# Patient Record
Sex: Male | Born: 1945 | Race: White | Hispanic: No | Marital: Single | Smoking: Former smoker
Health system: Southern US, Community
[De-identification: ages and names within clinical notes are randomized; demographics above are authoritative.]

## PROBLEM LIST (undated history)

## (undated) DIAGNOSIS — F4310 Post-traumatic stress disorder, unspecified: Secondary | ICD-10-CM

## (undated) DIAGNOSIS — I10 Essential (primary) hypertension: Secondary | ICD-10-CM

---

## 2012-04-01 ENCOUNTER — Ambulatory Visit: Payer: Self-pay | Admitting: Internal Medicine

## 2012-10-25 ENCOUNTER — Ambulatory Visit: Payer: Self-pay | Admitting: Internal Medicine

## 2014-02-20 IMAGING — CR RIGHT INDEX FINGER 2+V
1 series · 3 of 3 positions shown · non-contrast
Comparison: none

REASON FOR EXAM: pain and swelling after hitting it playing with a dog
COMMENTS:

PROCEDURE:     MDR - MDR FINGER INDEX 2ND DIG RT HA  - October 25, 2012 [DATE]
RESULT:     There is no evidence of fracture, dislocation, or malalignment.

[Series 1: pa · 0.17mm/px · 3 of 3 slices shown]
[im 1/3]
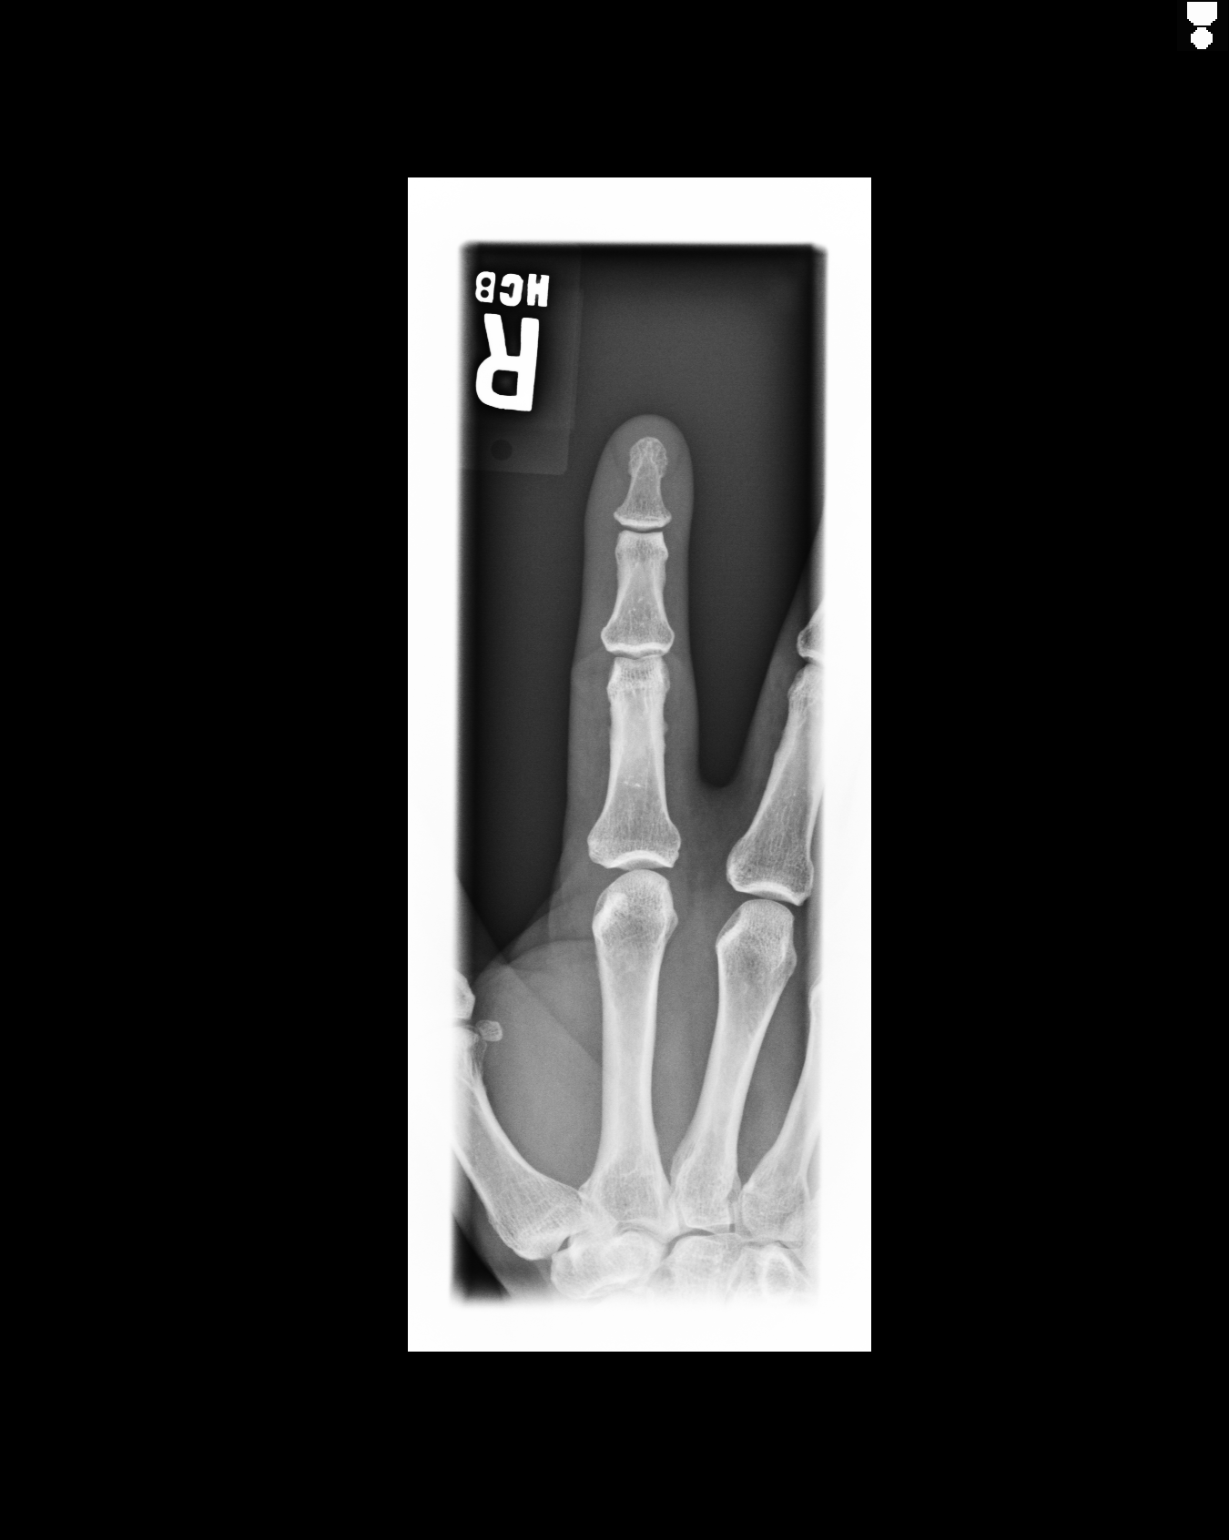
[im 2/3]
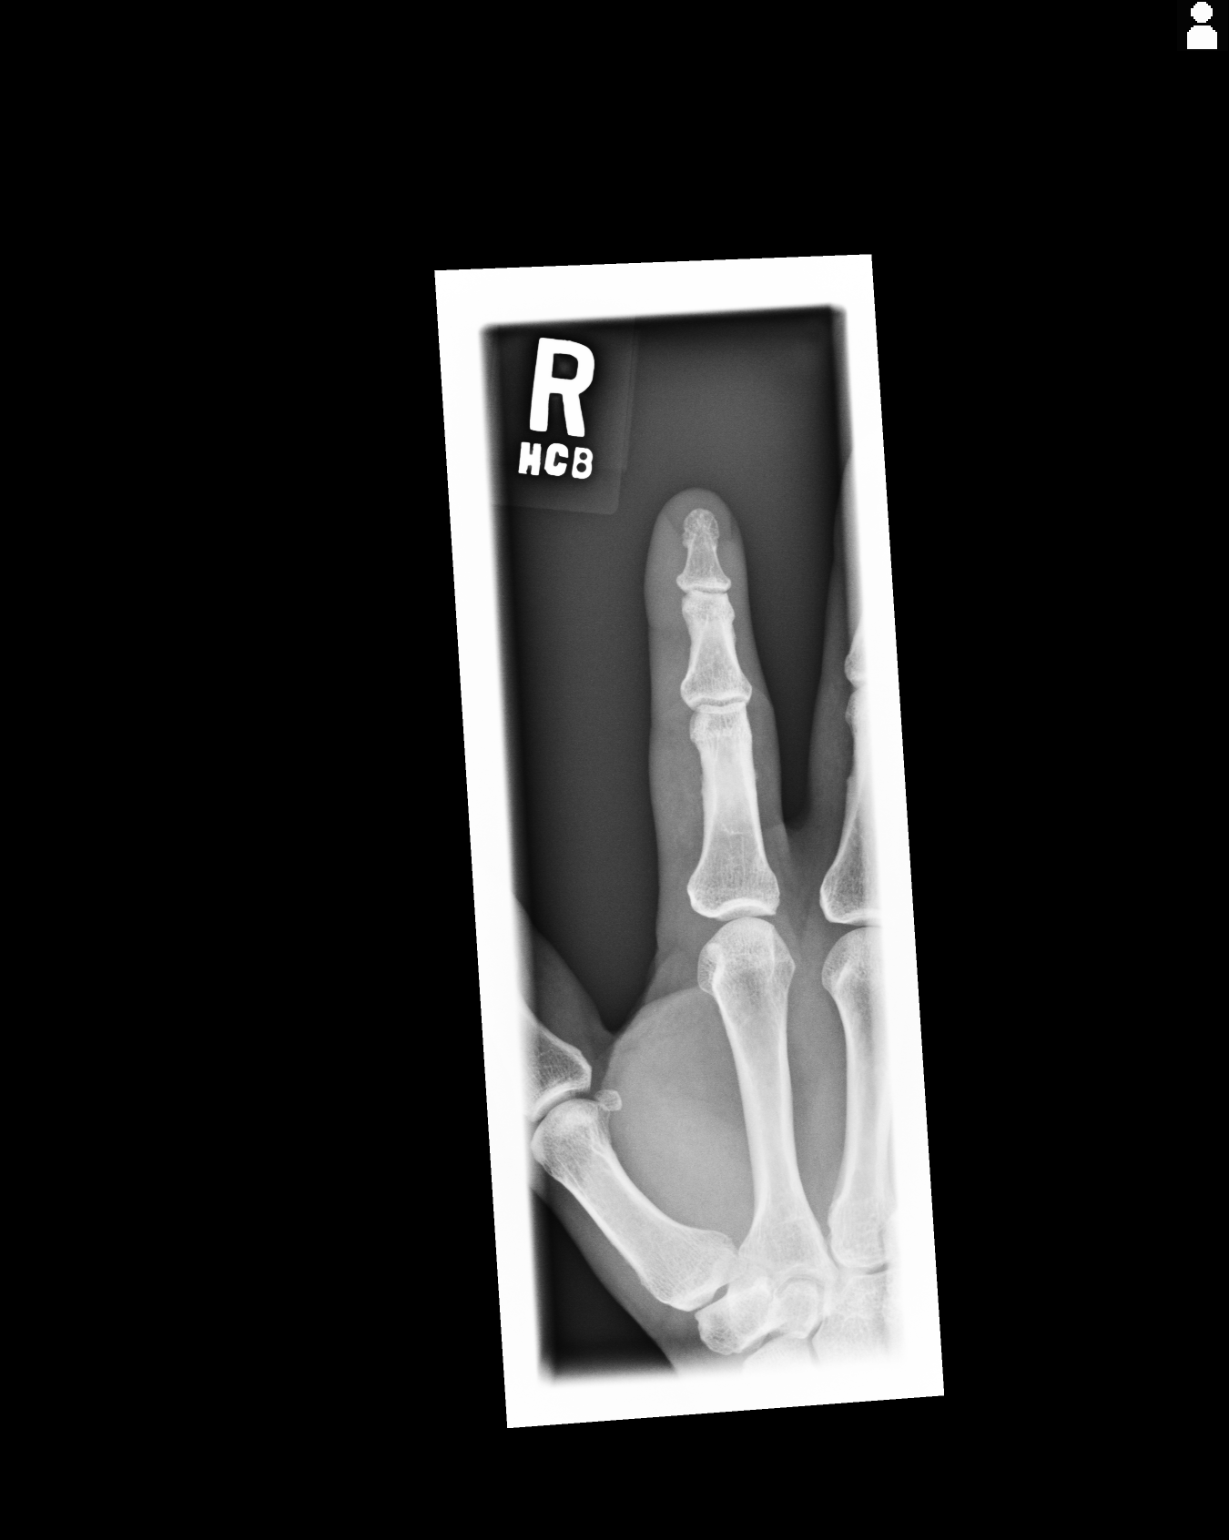
[im 3/3]
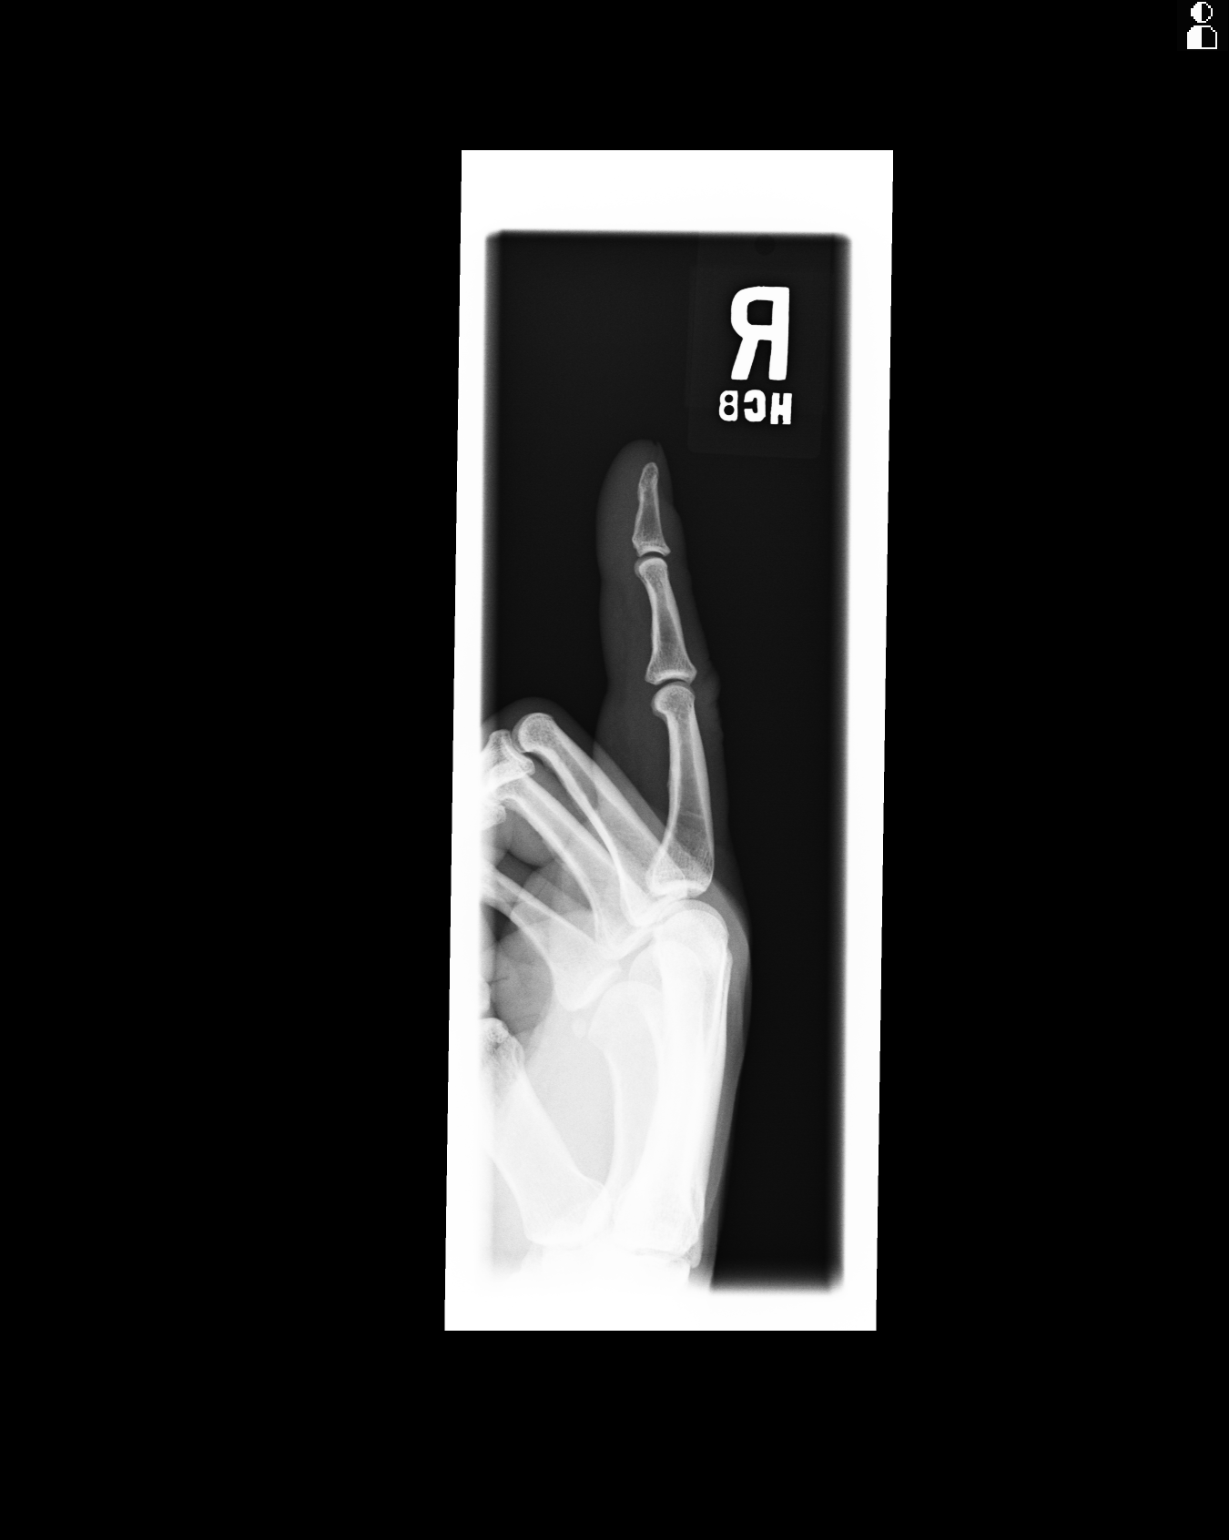

[3 of 3 positions shown; findings below may reference images not displayed]

IMPRESSION: 1. No evidence of acute abnormalities.
2. If there are persistent complaints of pain or persistent clinical
concern, a repeat evaluation in 7-10 days is recommended if clinically
warranted.

## 2015-05-31 ENCOUNTER — Ambulatory Visit
Admission: EM | Admit: 2015-05-31 | Discharge: 2015-05-31 | Disposition: A | Discharge status: Home / Self Care | Payer: PPO | Attending: Family Medicine | Admitting: Family Medicine

## 2015-05-31 DIAGNOSIS — I10 Essential (primary) hypertension: Secondary | ICD-10-CM | POA: Diagnosis not present

## 2015-05-31 HISTORY — DX: Post-traumatic stress disorder, unspecified: F43.10

## 2015-05-31 MED ORDER — CLONIDINE HCL 0.1 MG PO TABS
0.1000 mg | ORAL_TABLET | Freq: Once | ORAL | Status: AC
  Administered 2015-05-31: 0.1 mg via ORAL

## 2015-05-31 NOTE — ED Provider Notes (Signed)
CSN: [id number]     Arrival date & time 05/31/15  1245 History   First MD Initiated Contact with Patient 05/31/15 1435     Chief Complaint  Patient presents with  . Hypertension   (Consider location/radiation/quality/duration/timing/severity/associated sxs/prior Treatment) HPI Comments: 69 yo male presents with a c/o elevated blood pressure for the past 2-3 days. States does not have a h/o hypertension. Denies any chest pain, shortness of breath, vision changes, numbness/tingling or weakness. States has had a headache. Also states last week he took an over the counter supplement "Steel Libido Red" and "my body didn't feel right after taking it".   Patient is a 69 y.o. male presenting with hypertension. The history is provided by the patient.  Hypertension    Past Medical History  Diagnosis Date  . Post traumatic stress disorder (PTSD)    History reviewed. No pertinent past surgical history. History reviewed. No pertinent family history. Social History  Substance Use Topics  . Smoking status: Former Games developermoker  . Smokeless tobacco: None  . Alcohol Use: Yes     Comment: soccially    Review of Systems  Allergies  Latex  Home Medications   Prior to Admission medications   Medication Sig Start Date End Date Taking? Authorizing Provider  terazosin (HYTRIN) 10 MG capsule Take 10 mg by mouth at bedtime.   Yes Historical Provider, MD   Meds Ordered and Administered this Visit   Medications  cloNIDine (CATAPRES) tablet 0.1 mg (0.1 mg Oral Given 05/31/15 1455)    BP 168/90 mmHg  Pulse 47  Temp(Src) 97.6 F (36.4 C) (Tympanic)  Resp 16  Ht 5\' 8"  (1.727 m)  Wt 135 lb (61.236 kg)  BMI 20.53 kg/m2  SpO2 100% No data found.   Physical Exam  Constitutional: He appears well-developed and well-nourished. No distress.  Cardiovascular: Normal rate, regular rhythm and normal heart sounds.   Pulmonary/Chest: Effort normal and breath sounds normal. No respiratory distress. He has no  wheezes. He has no rales.  Musculoskeletal: He exhibits no edema.  Skin: He is not diaphoretic.  Nursing note and vitals reviewed.   ED Course  Procedures (including critical care time)  Labs Review Labs Reviewed - No data to display  Imaging Review No results found.   Visual Acuity Review  Right Eye Distance:   Left Eye Distance:   Bilateral Distance:    Right Eye Near:   Left Eye Near:    Bilateral Near:         MDM   1. Essential hypertension    1. diagnosis reviewed with patient 2. Patient given clonidine 0.1mg  po x 1 with improvement of blood pressure reading (164/90 on recheck) 3. Recommend patient [facility] for recheck bp and possibly start chronic antihypertensive   Payton Mccallumrlando Kavian Peters, MD 05/31/15 2122
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
## 2015-05-31 NOTE — Discharge Instructions (Signed)
Hypertension Hypertension, commonly called high blood pressure, is when the force of blood pumping through your arteries is too strong. Your arteries are the blood vessels that carry blood from your heart throughout your body. A blood pressure reading consists of a higher number over a lower number, such as 110/72. The higher number (systolic) is the pressure inside your arteries when your heart pumps. The lower number (diastolic) is the pressure inside your arteries when your heart relaxes. Ideally you want your blood pressure below 120/80. Hypertension forces your heart to work harder to pump blood. Your arteries may become narrow or stiff. Having untreated or uncontrolled hypertension can cause heart attack, stroke, kidney disease, and other problems. RISK FACTORS Some risk factors for high blood pressure are controllable. Others are not.  Risk factors you cannot control include:   Race. You may be at higher risk if you are African American.  Age. Risk increases with age.  Gender. Men are at higher risk than women before age 45 years. After age 65, women are at higher risk than men. Risk factors you can control include:  Not getting enough exercise or physical activity.  Being overweight.  Getting too much fat, sugar, calories, or salt in your diet.  Drinking too much alcohol. SIGNS AND SYMPTOMS Hypertension does not usually cause signs or symptoms. Extremely high blood pressure (hypertensive crisis) may cause headache, anxiety, shortness of breath, and nosebleed. DIAGNOSIS To check if you have hypertension, your health care provider will measure your blood pressure while you are seated, with your arm held at the level of your heart. It should be measured at least twice using the same arm. Certain conditions can cause a difference in blood pressure between your right and left arms. A blood pressure reading that is higher than normal on one occasion does not mean that you need treatment. If  it is not clear whether you have high blood pressure, you may be asked to return on a different day to have your blood pressure checked again. Or, you may be asked to monitor your blood pressure at home for 1 or more weeks. TREATMENT Treating high blood pressure includes making lifestyle changes and possibly taking medicine. Living a healthy lifestyle can help lower high blood pressure. You may need to change some of your habits. Lifestyle changes may include:  Following the DASH diet. This diet is high in fruits, vegetables, and whole grains. It is low in salt, red meat, and added sugars.  Keep your sodium intake below 2,300 mg per day.  Getting at least 30-45 minutes of aerobic exercise at least 4 times per week.  Losing weight if necessary.  Not smoking.  Limiting alcoholic beverages.  Learning ways to reduce stress. Your health care provider may prescribe medicine if lifestyle changes are not enough to get your blood pressure under control, and if one of the following is true:  You are 18-59 years of age and your systolic blood pressure is above 140.  You are 60 years of age or older, and your systolic blood pressure is above 150.  Your diastolic blood pressure is above 90.  You have diabetes, and your systolic blood pressure is over 140 or your diastolic blood pressure is over 90.  You have kidney disease and your blood pressure is above 140/90.  You have heart disease and your blood pressure is above 140/90. Your personal target blood pressure may vary depending on your medical conditions, your age, and other factors. HOME CARE INSTRUCTIONS    Have your blood pressure rechecked as directed by your health care provider.   Take medicines only as directed by your health care provider. Follow the directions carefully. Blood pressure medicines must be taken as prescribed. The medicine does not work as well when you skip doses. Skipping doses also puts you at risk for  problems.  Do not smoke.   Monitor your blood pressure at home as directed by your health care provider. SEEK MEDICAL CARE IF:   You think you are having a reaction to medicines taken.  You have recurrent headaches or feel dizzy.  You have swelling in your ankles.  You have trouble with your vision. SEEK IMMEDIATE MEDICAL CARE IF:  You develop a severe headache or confusion.  You have unusual weakness, numbness, or feel faint.  You have severe chest or abdominal pain.  You vomit repeatedly.  You have trouble breathing. MAKE SURE YOU:   Understand these instructions.  Will watch your condition.  Will get help right away if you are not doing well or get worse.   This information is not intended to replace advice given to you by your health care provider. Make sure you discuss any questions you have with your health care provider.   Document Released: 07/16/2005 Document Revised: 11/30/2014 Document Reviewed: 05/08/2013 Elsevier Interactive Patient Education 2016 Elsevier Inc.  

## 2017-06-14 ENCOUNTER — Ambulatory Visit
Admission: EM | Admit: 2017-06-14 | Discharge: 2017-06-14 | Disposition: A | Discharge status: Home / Self Care | Payer: PPO | Attending: Emergency Medicine | Admitting: Emergency Medicine

## 2017-06-14 ENCOUNTER — Encounter: Payer: Self-pay | Admitting: *Deleted

## 2017-06-14 DIAGNOSIS — R002 Palpitations: Secondary | ICD-10-CM

## 2017-06-14 DIAGNOSIS — I495 Sick sinus syndrome: Secondary | ICD-10-CM | POA: Diagnosis not present

## 2017-06-14 DIAGNOSIS — I10 Essential (primary) hypertension: Secondary | ICD-10-CM | POA: Diagnosis not present

## 2017-06-14 DIAGNOSIS — H538 Other visual disturbances: Secondary | ICD-10-CM | POA: Insufficient documentation

## 2017-06-14 DIAGNOSIS — F4310 Post-traumatic stress disorder, unspecified: Secondary | ICD-10-CM | POA: Insufficient documentation

## 2017-06-14 DIAGNOSIS — T887XXA Unspecified adverse effect of drug or medicament, initial encounter: Secondary | ICD-10-CM

## 2017-06-14 DIAGNOSIS — Z91040 Latex allergy status: Secondary | ICD-10-CM | POA: Insufficient documentation

## 2017-06-14 DIAGNOSIS — Z87891 Personal history of nicotine dependence: Secondary | ICD-10-CM | POA: Diagnosis not present

## 2017-06-14 DIAGNOSIS — T50905A Adverse effect of unspecified drugs, medicaments and biological substances, initial encounter: Secondary | ICD-10-CM

## 2017-06-14 DIAGNOSIS — Z5321 Procedure and treatment not carried out due to patient leaving prior to being seen by health care provider: Secondary | ICD-10-CM | POA: Insufficient documentation

## 2017-06-14 HISTORY — DX: Essential (primary) hypertension: I10

## 2017-06-14 LAB — BASIC METABOLIC PANEL
Anion gap: 7 (ref 5–15)
BUN: 15 mg/dL (ref 6–20)
CALCIUM: 9.5 mg/dL (ref 8.9–10.3)
CO2: 28 mmol/L (ref 22–32)
CREATININE: 0.83 mg/dL (ref 0.61–1.24)
Chloride: 104 mmol/L (ref 101–111)
GFR calc Af Amer: >60 mL/min (ref >60)
Glucose, Bld: 97 mg/dL (ref 65–99)
Potassium: 4 mmol/L (ref 3.5–5.1)
SODIUM: 139 mmol/L (ref 135–145)

## 2017-06-14 MED ORDER — LISINOPRIL 10 MG PO TABS: 10.0000 mg | ORAL_TABLET | Freq: Every day | ORAL | 0 refills | Status: DC

## 2017-06-14 MED ORDER — CLONIDINE HCL 0.2 MG PO TABS
0.2000 mg | ORAL_TABLET | Freq: Once | ORAL | Status: AC
  Administered 2017-06-14: 0.2 mg via ORAL
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
## 2017-06-14 NOTE — ED Triage Notes (Signed)
Pt c/o "heart racing" and weakness. Onset approx 0600. Denies chest pain, dyspnea.

## 2017-06-14 NOTE — Discharge Instructions (Addendum)
Decrease your salt intake. diet and exercise will lower your blood pressure significantly. It is important to keep your blood pressure under good control, as having a elevated for prolonged periods of time significantly increases your risk of stroke, heart attacks, kidney damage, eye damage, and other problems. Measure your blood pressure once a day, preferably at the same time every day. Keep a log of this and bring it to your next doctor's appointment.  See list below for primary care referral.  Return immediately to the ER if you start having chest pain, headache, problems seeing, problems talking, problems walking, if you feel like you're about to pass out, if you do pass out, if you have a seizure, if your blood pressure stays definitely elevated despite being on the medications, or for any other concerns.  Here is a list of primary care providers who are taking new patients:  Dr. Elizabeth Sauereanna Jones, Dr. Schuyler AmorWilliam Plonk [address] [place] [address] [phone]  [facility] [place] [address]  [place] [address]  [phone]  [facility] [address]  [place], [place] [address] [phone]  [facility] [place] [address]  [phone] [place], [place] [address]  Here are clinics/ other resources who will see you if you do not have insurance. Some have certain criteria that you must meet. Call them and find out what they are:  [facility]: [address] [place] [place], [place] [address] Phone: [phone] Hours: First and Third Saturdays of each Month, 9 a.m. - 1 p.m.  [facility]: [address], [place], [place] [address] Phone: [phone] Hours: Tuesday, 4 p.m. - 8 p.m. Thursday, 1 p.m. - 8 p.m. Wednesday, 9 a.m. - [facility]  [facility] [address], [place], [place] [address] Phone: [phone] Pharmacy Phone Number: [phone] Dental Phone Number: [phone] [facility] Insurance Help:  [phone]  Dental Hours: Monday - Thursday, 8 a.m. - 6 p.m.  [facility] [address] [place], [place] [address] Phone: [phone] Pharmacy Phone Number: [phone] [facility] Insurance Help: [phone]  [facility] [address] [place], [place] [address] Phone: [phone] Pharmacy Phone Number: [phone] [facility] Insurance Help: [phone]  [facility] [address] [place], [place] [address] Phone: [phone] [facility] Insurance Help: [phone]   [facility] [address] [place], [place] [address] Phone: [phone]  Go to www.goodrx.com to look up your medications. This will give you a list of where you can find your prescriptions at the most affordable prices. Or ask the pharmacist what the cash price is, or if they have any other discount programs available to help make your medication more affordable. This can be less expensive than what you would pay with insurance.

## 2017-06-14 NOTE — ED Provider Notes (Signed)
HPI  SUBJECTIVE:  Martin Bauer is a 71 y.o. male who presents with sensation of "my heart beating out of my chest "starting this morning around [phone].  States he woke up with it.  States that he had taken 50 mg of Seroquel and took 2 "swallows "of liquid diphenhydramine 50 mg last night in an effort to sleep.  He states that he did not measure the Benadryl out.  He has not taken the Seroquel in several months.  He reports palpitations, and states that they have mostly resolved.  This lasted approximately 1 1/2-2 hours.  He denies chest pain, pressure, heaviness, nausea, diaphoresis, shortness of breath, weakness.  No abdominal pain, syncope.  He denies taking any supplements.  Measured his blood pressure at home he reports blurry vision, but states that this has cleared,. He measured his blood pressure at home and it was 188/59.   States he is not on any blood pressure medicines and does not take any medicines on a regular basis.  There are no aggravating or alleviating factors. there is no exertional component to this.  He has not tried anything for this.  No symptoms like this before. He has a past medical history of hypertension but has not taken his amlodipine "in a while because it was not working".  No history of MI, coronary disease, hypercholesterolemia, atrial fibrillation, arrhythmia.  [facility]  Past [facility] History:  Diagnosis Date  . Hypertension   . Post traumatic stress disorder (PTSD)     History reviewed. No pertinent surgical history.  History reviewed. No pertinent family history.  Social History   Tobacco Use  . Smoking status: Former Games developermoker  . Smokeless tobacco: Never Used  Substance Use Topics  . Alcohol use: Yes    Comment: soccially  . Drug use: No    No current facility-administered medications for this encounter.   Current Outpatient Medications:  .  amLODipine (NORVASC) 10 MG tablet, Take 10 mg daily by mouth., Disp: , Rfl:  .  QUEtiapine (SEROQUEL) 50  MG tablet, Take 50 mg at bedtime by mouth., Disp: , Rfl:  .  lisinopril (PRINIVIL,ZESTRIL) 10 MG tablet, Take 1 tablet (10 mg total) daily by mouth., Disp: 30 tablet, Rfl: 0 .  terazosin (HYTRIN) 10 MG capsule, Take 10 mg by mouth at bedtime., Disp: , Rfl:   Allergies  Allergen Reactions  . Latex Rash     ROS  As noted in HPI.   Physical Exam  BP (!) 187/80 (BP Location: Left Arm)   Pulse (!) 53   Temp 97.7 F (36.5 C) (Oral)   Resp 16   SpO2 100%    Vitals:   06/14/17 0810 06/14/17 0859 06/14/17 1016  BP: (!) 229/103 (!) 221/110 (!) 187/80  Pulse: (!) 53    Resp: 16    Temp: 97.7 F (36.5 C)    TempSrc: Oral    SpO2: 100%     BP Readings from Last 3 Encounters:  06/14/17 (!) 187/80  05/31/15 (!) 168/90   Constitutional: Well developed, well nourished, no acute distress Eyes:  EOMI, conjunctiva normal bilaterally HENT: Normocephalic, atraumatic,mucus membranes moist Respiratory: Normal inspiratory effort is clear bilaterally Cardiovascular: Regular bradycardia, no murmurs, rubs, gallops. RP, PT 2+ and equal bilaterally  GI: nondistended soft, nontender, with active bowel sounds  skin: No rash, skin intact Musculoskeletal: no deformities, no edema, calves symmetric, nontender Neurologic: Alert & oriented x 3, no focal neuro deficits Psychiatric: Speech and behavior appropriate  ED Course   Medications  cloNIDine (CATAPRES) tablet 0.2 mg (0.2 mg Oral Given 06/14/17 1000)    Orders Placed This Encounter  Procedures  . Basic metabolic panel    Standing Status:   Standing    Number of Occurrences:   1  . Re-check Vital Signs    Standing Status:   Standing    Number of Occurrences:   1  . Re-check Vital Signs    In 30 min after meds    Standing Status:   Standing    Number of Occurrences:   1    Results for orders placed or performed during the hospital encounter of 06/14/17 (from the past 24 hour(s))  Basic metabolic panel     Status: None    Collection Time: 06/14/17  9:00 AM  Result Value Ref Range   Sodium 139 135 - 145 mmol/L   Potassium 4.0 3.5 - 5.1 mmol/L   Chloride 104 101 - 111 mmol/L   CO2 28 22 - 32 mmol/L   Glucose, Bld 97 65 - 99 mg/dL   BUN 15 6 - 20 mg/dL   Creatinine, Ser 1.61 0.61 - 1.24 mg/dL   Calcium 9.5 8.9 - 09.6 mg/dL   GFR calc non Af Amer >60 >60 mL/min   GFR calc Af Amer >60 >60 mL/min   Anion gap 7 5 - 15   No results found.  ED Clinical Impression  Essential hypertension  Palpitations  Adverse drug interaction with prescription medication   ED Assessment/Plan  Presentation consistent with adverse medication side effects/interaction.  He has no evidence of STEMI on EKG, he has no evidence of stroke.  EKG: Sinus bradycardia, rate 56, normal axis, normal intervals.  Positive LVH.  No ST-T wave changes.  No previous EKG for comparison.  Pt hypertensive today. Has not taken BP meds in months.  States that he is not on anything other than the amlodipine 5 mg. Pt has no evidence of end organ damage.  Patient does report some blurry vision, but feel that this is most likely a combination between the Seroquel and Benadryl side effects and he states that it is clearing . pt denies any CNS type sx such as HA,  focal paresis, or new onset seizure activity. Pt denies any CV sx such as CP, dyspnea, palpitations, pedal edema, tearing pain radiating to back or abd. Pt denied any renal sx such as anuria or hematuria.    Plan was to initiate blood work and to start him on another blood pressure medicine, most likely hydrochlorothiazide, but staff was unable to draw any blood.  Patient became confrontational to the staff.  Patient decided to leave AMA.  Discussed with him that we needed to start him on a blood pressure medicine as his risk of stroke or heart attack is extremely high with numbers like this, but he opted to leave.  Encouraged patient to stay several times, but he insisted on leaving.  Patient  returned.  We were able to obtain a blood sample.  Give 0.2 mg of clonidine and will repeat his blood pressure and 20-30 minutes.   Pressure trending down.  Patient still asymptomatic. BMP normal.  Will try lisinopril 10 mg daily.  advised him to keep a log of his blood pressure. He is to measure his blood pressure once a day perferably at the same time a day, and take it to [place] [facility] [place] [facility] [place]  We will also give a primary  care referral in case the patient cannot be [facility] by [facility] in a timely fashion, discussed with him that he will need to go to the ER if his blood pressure stays elevated despite the medication, signs and symptoms of a hypertensive emergency.  He agrees with plan.   Meds ordered this encounter  Medications  . QUEtiapine (SEROQUEL) 50 MG tablet    Sig: Take 50 mg at bedtime by mouth.  Marland Kitchen. amLODipine (NORVASC) 10 MG tablet    Sig: Take 10 mg daily by mouth.  . cloNIDine (CATAPRES) tablet 0.2 mg  . lisinopril (PRINIVIL,ZESTRIL) 10 MG tablet    Sig: Take 1 tablet (10 mg total) daily by mouth.    Dispense:  30 tablet    Refill:  0    *This clinic note was created using Scientist, clinical (histocompatibility and immunogenetics)Dragon dictation software. Therefore, there may be occasional mistakes despite careful proofreading.   ?    Domenick GongMortenson, Taylorann Tkach, MD 06/14/17 1103

## 2017-06-17 DIAGNOSIS — F4329 Adjustment disorder with other symptoms: Secondary | ICD-10-CM | POA: Diagnosis not present

## 2017-06-17 DIAGNOSIS — I10 Essential (primary) hypertension: Secondary | ICD-10-CM | POA: Diagnosis not present

## 2017-06-24 ENCOUNTER — Encounter: Payer: Self-pay | Admitting: *Deleted

## 2017-06-24 ENCOUNTER — Other Ambulatory Visit: Payer: Self-pay

## 2017-06-24 ENCOUNTER — Ambulatory Visit
Admission: EM | Admit: 2017-06-24 | Discharge: 2017-06-24 | Disposition: A | Discharge status: Home / Self Care | Payer: PPO | Attending: Family Medicine | Admitting: Family Medicine

## 2017-06-24 DIAGNOSIS — I160 Hypertensive urgency: Secondary | ICD-10-CM

## 2017-06-24 DIAGNOSIS — H5310 Unspecified subjective visual disturbances: Secondary | ICD-10-CM | POA: Diagnosis not present

## 2017-06-24 MED ORDER — LISINOPRIL 20 MG PO TABS: 20.0000 mg | ORAL_TABLET | Freq: Every day | ORAL | Status: DC

## 2017-06-24 MED ORDER — AMLODIPINE BESYLATE 10 MG PO TABS: 10.0000 mg | ORAL_TABLET | Freq: Every day | ORAL | 1 refills | Status: DC

## 2017-06-24 NOTE — Discharge Instructions (Signed)
Lisinopril 20 mg daily.  Start the Norvasc in addition to your Lisinopril.  Follow up with your Doctor on Wed.  Take care  Dr. Adriana Simasook

## 2017-06-24 NOTE — ED Provider Notes (Signed)
[facility]    CSN: [id number] Arrival date & time: 06/24/17  1905  History   Chief Complaint Chief Complaint  Patient presents with  . Hypertension   HPI  71 year old male with ongoing uncontrolled hypertension presents with hypertension.  BP continues to be elevated.  He states that he has had systolics in the 200s.  He has been seen at urgent care 2 times.  He has also been to the [facility].  He is currently taking lisinopril daily.  He is now taking 30 mg total.  He states his blood pressure continues to be elevated and he has had no improvement with the medication.  No reports of chest pain or shortness of breath.  He does endorse intermittent vision changes.  No other associated symptoms.  No other complaints at this time.  Social History Social History   Tobacco Use  . Smoking status: Former Games developermoker  . Smokeless tobacco: Never Used  Substance Use Topics  . Alcohol use: Yes    Comment: soccially  . Drug use: No   Allergies   Latex  Review of Systems Review of Systems  Eyes: Positive for visual disturbance.  Respiratory: Negative for shortness of breath.   Cardiovascular: Negative for chest pain.   Physical Exam Triage Vital Signs ED Triage Vitals  Enc Vitals Group     BP 06/24/17 1923 (!) 183/74     Pulse Rate 06/24/17 1923 (!) 49     Resp 06/24/17 1923 18     Temp 06/24/17 1923 98 F (36.7 C)     Temp Source 06/24/17 1923 Oral     SpO2 06/24/17 1923 100 %     Weight 06/24/17 1925 127 lb (57.6 kg)     Height 06/24/17 1925 5\' 8"  (1.727 m)     Head Circumference --      Peak Flow --      Pain Score 06/24/17 1926 6     Pain Loc --      Pain Edu? --      Excl. in GC? --    Updated Vital Signs BP (!) 183/74 (BP Location: Left Arm)   Pulse (!) 49   Temp 98 F (36.7 C) (Oral)   Resp 18   Ht 5\' 8"  (1.727 m)   Wt 127 lb (57.6 kg)   SpO2 100%   BMI 19.31 kg/m   Physical Exam  Constitutional: He appears well-developed. No distress.  HENT:    Head: Normocephalic and atraumatic.  Nose: Nose normal.  Eyes: Conjunctivae and EOM are normal. Pupils are equal, round, and reactive to light. No scleral icterus.  Cardiovascular: Regular rhythm.  No murmur heard. Bradycardic.  Pulmonary/Chest: Effort normal and breath sounds normal. He has no wheezes. He has no rales.  Skin: Skin is warm. No rash noted.  Psychiatric:  Flat affect, depressed mood.  Patient very guarded and does not want to give much history/talk.  Vitals reviewed.  UC Treatments / Results  Labs (all labs ordered are listed, but only abnormal results are displayed) Labs Reviewed - No data to display  EKG  EKG Interpretation None       Radiology No results found.  Procedures Procedures (including critical care time)  Medications Ordered in UC Medications - No data to display   Initial Impression / Assessment and Plan / UC Course  I have reviewed the triage vital signs and the nursing notes.  Pertinent labs & imaging results that were available during my care of  the patient were reviewed by me and considered in my medical decision making (see chart for details).     71 year old male presents with hypertensive urgency.  Patient has uncontrolled hypertension.  Advised continued use of lisinopril 20 mg daily.  Adding Norvasc 10 mg daily.  Has appointment with primary care provider on Wednesday.  Needs repeat labs.  Will likely need additional medication.  Final Clinical Impressions(s) / UC Diagnoses   Final diagnoses:  Hypertensive urgency    ED Discharge Orders        Ordered    lisinopril (PRINIVIL,ZESTRIL) 20 MG tablet  Daily     06/24/17 2023    amLODipine (NORVASC) 10 MG tablet  Daily     06/24/17 2023     Controlled Substance Prescriptions [place] Controlled Substance Registry consulted? Not Applicable   Tommie SamsCook, Wessie Shanks G, DO 06/24/17 2109

## 2017-06-24 NOTE — ED Triage Notes (Signed)
PAtient continues to have high BP readings at home even while taking medication.

## 2017-06-26 DIAGNOSIS — I10 Essential (primary) hypertension: Secondary | ICD-10-CM | POA: Diagnosis not present

## 2017-06-26 DIAGNOSIS — F3162 Bipolar disorder, current episode mixed, moderate: Secondary | ICD-10-CM | POA: Diagnosis not present

## 2020-05-21 ENCOUNTER — Ambulatory Visit
Admission: EM | Admit: 2020-05-21 | Discharge: 2020-05-21 | Disposition: A | Discharge status: Home / Self Care | Payer: PPO | Attending: Family Medicine | Admitting: Family Medicine

## 2020-05-21 ENCOUNTER — Other Ambulatory Visit: Payer: Self-pay

## 2020-05-21 DIAGNOSIS — I10 Essential (primary) hypertension: Secondary | ICD-10-CM

## 2020-05-21 DIAGNOSIS — R079 Chest pain, unspecified: Secondary | ICD-10-CM | POA: Diagnosis not present

## 2020-05-21 MED ORDER — PANTOPRAZOLE SODIUM 40 MG PO TBEC: 40.0000 mg | DELAYED_RELEASE_TABLET | Freq: Every day | ORAL | 0 refills | Status: AC

## 2020-05-21 MED ORDER — CLONIDINE HCL 0.1 MG PO TABS: 0.1000 mg | ORAL_TABLET | Freq: Two times a day (BID) | ORAL | 0 refills | Status: AC

## 2020-05-21 MED ORDER — PANTOPRAZOLE SODIUM 40 MG PO TBEC: 40.0000 mg | DELAYED_RELEASE_TABLET | Freq: Every day | ORAL | 0 refills | Status: DC

## 2020-05-21 MED ORDER — CLONIDINE HCL 0.1 MG PO TABS: 0.1000 mg | ORAL_TABLET | Freq: Two times a day (BID) | ORAL | 0 refills | Status: DC

## 2020-05-21 NOTE — Discharge Instructions (Signed)
Stop your current medication.  Start new medications. Take as prescribed.  [facility]  Take care  Dr. Adriana Simas

## 2020-05-21 NOTE — ED Provider Notes (Signed)
[facility]    CSN: [id number] Arrival date & time: 05/21/20  1359      History   Chief Complaint Chief Complaint  Patient presents with  . Chest Pain   HPI  74 year old male presents with chest pain and elevated blood pressure.  Patient reports ongoing "heart problems" for the past few years.  He has uncontrolled hypertension as well.  He has been seen here previously for this. Reports ongoing chest pain.  His timeframe is difficult to discern.  Pain seems to be sporadic and comes in different locations.  Currently left-sided.  Patient grimaces frequently throughout the visit.  He states that the pain is sharp.  4/10 in severity.  Patient is concerned that this is pain is from lisinopril which she has recently restarted.  He states that he would not like to take this medication any longer.  Patient also reports that he has ongoing irritation in his throat.  He is being treated with omeprazole and does not feel like it works.   Past Medical History:  Diagnosis Date  . Hypertension   . Post traumatic stress disorder (PTSD)    Home Medications    Prior to Admission medications   Medication Sig Start Date End Date Taking? Authorizing Provider  lisinopril (PRINIVIL,ZESTRIL) 20 MG tablet Take 1 tablet (20 mg total) by mouth daily. 06/24/17 05/21/20 Yes Jalise Zawistowski G, DO  omeprazole (PRILOSEC) 20 MG capsule Take 20 mg by mouth daily.  05/21/20 Yes [provider]  cloNIDine (CATAPRES) 0.1 MG tablet Take 1 tablet (0.1 mg total) by mouth 2 (two) times daily. 05/21/20   Tommie Sams, DO  pantoprazole (PROTONIX) 40 MG tablet Take 1 tablet (40 mg total) by mouth daily. 05/21/20   Tommie Sams, DO  amLODipine (NORVASC) 10 MG tablet Take 1 tablet (10 mg total) by mouth daily. 06/24/17 05/21/20  Tommie Sams, DO   Social History Social History   Tobacco Use  . Smoking status: Former Games developer  . Smokeless tobacco: Never Used  Vaping Use  . Vaping Use: Never used   Substance Use Topics  . Alcohol use: Yes    Comment: soccially  . Drug use: No   Allergies   Latex   Review of Systems Review of Systems   Physical Exam Triage Vital Signs ED Triage Vitals  Enc Vitals Group     BP 05/21/20 1415 (!) 188/77     Pulse Rate 05/21/20 1415 (!) 53     Resp 05/21/20 1415 18     Temp 05/21/20 1415 97.7 F (36.5 C)     Temp Source 05/21/20 1415 Oral     SpO2 05/21/20 1415 100 %     Weight --      Height --      Head Circumference --      Peak Flow --      Pain Score 05/21/20 1414 4     Pain Loc --      Pain Edu? --      Excl. in GC? --    Updated Vital Signs BP (!) 188/77 (BP Location: Right Arm)   Pulse (!) 53   Temp 97.7 F (36.5 C) (Oral)   Resp 18   SpO2 100%   Visual Acuity Right Eye Distance:   Left Eye Distance:   Bilateral Distance:    Right Eye Near:   Left Eye Near:    Bilateral Near:     Physical Exam Vitals and nursing  note reviewed.  Constitutional:      General: He is not in acute distress.    Appearance: Normal appearance. He is not ill-appearing.  HENT:     Head: Normocephalic and atraumatic.  Eyes:     General:        Right eye: No discharge.        Left eye: No discharge.     Conjunctiva/sclera: Conjunctivae normal.  Cardiovascular:     Rate and Rhythm: Regular rhythm. Bradycardia present.     Heart sounds: No murmur heard.   Pulmonary:     Effort: Pulmonary effort is normal.     Breath sounds: Normal breath sounds. No wheezing, rhonchi or rales.  Abdominal:     General: There is no distension.     Palpations: Abdomen is soft.     Tenderness: There is no abdominal tenderness.  Neurological:     Mental Status: He is alert.  Psychiatric:     Comments: Tangential.  Difficult to follow.  Argumentative at times.    UC Treatments / Results  Labs (all labs ordered are listed, but only abnormal results are displayed) Labs Reviewed - No data to display  EKG Interpretation: Sinus bradycardia at the  rate of 48.  Normal axis.  Probable LAE.  No ST or T wave changes.  Radiology No results found.  Procedures Procedures (including critical care time)  Medications Ordered in UC Medications - No data to display  Initial Impression / Assessment and Plan / UC Course  I have reviewed the triage vital signs and the nursing notes.  Pertinent labs & imaging results that were available during my care of the patient were reviewed by me and considered in my medical decision making (see chart for details).    74 year old male presents with chest pain and elevated blood pressure.  Throughout the visit, patient is difficult to follow.  Patient is dissatisfied with his current care at the [place].  Patient believes that his problems are related to his mental health and the fact that his Valium was discontinued several years ago.  In regards to his complaints today, his EKG is essentially unremarkable.  No signs of ischemia.  Blood pressure markedly elevated.  I believe that this is accommodation of being agitated and having underlying hypertension that is uncontrolled and not well treated.  Patient has recently restarted lisinopril but feels like is not working and that his current symptoms are related to the lisinopril.  He would like to discontinue this medication.  Placing patient on clonidine as this may help some of his mood disturbance in addition to his hypertension.  He has been on lisinopril, amlodipine, and HCTZ previously.  He needs close follow-up with the [place].  Additionally, patient is on omeprazole for suspected GERD.  He states that this does not seem to be working.  Stopping omeprazole.  Placing on Protonix.  Final Clinical Impressions(s) / UC Diagnoses   Final diagnoses:  Chest pain, unspecified type  Uncontrolled hypertension     Discharge Instructions     Stop your current medication.  Start new medications. Take as prescribed.  [facility]  Take care  Dr. Adriana Simas    ED  Prescriptions    Medication Sig Dispense Auth. Provider   pantoprazole (PROTONIX) 40 MG tablet  (Status: Discontinued) Take 1 tablet (40 mg total) by mouth daily. 90 tablet Jennell Janosik G, DO   cloNIDine (CATAPRES) 0.1 MG tablet  (Status: Discontinued) Take 1 tablet (0.1  mg total) by mouth 2 (two) times daily. 180 tablet Roth Ress G, DO   cloNIDine (CATAPRES) 0.1 MG tablet Take 1 tablet (0.1 mg total) by mouth 2 (two) times daily. 180 tablet Tanetta Fuhriman G, DO   pantoprazole (PROTONIX) 40 MG tablet Take 1 tablet (40 mg total) by mouth daily. 90 tablet Everlene Other G, DO     PDMP not reviewed this encounter.   Tommie Sams, [place] 05/21/20 1628

## 2020-05-21 NOTE — ED Triage Notes (Signed)
Patient complains of left sided chest pain that started 1 week ago but worsened today. Patient states that his blood pressure was 210/110 this morning. States that the dentist couldn't do dental work due to BP this week.

## 2020-05-30 ENCOUNTER — Telehealth: Payer: Self-pay

## 2020-05-30 NOTE — Telephone Encounter (Signed)
Appointment - Patient left 2 messages requesting a call back to be set up as a new patient to see a psychiatrist.

## 2020-05-30 NOTE — Telephone Encounter (Signed)
Done - pt was called and explained that at this time dr. Elna Breslow is not accepting any new pt and that we are not doing face to face appt only virtual. Pt wanted a face to face.

## 2020-06-26 ENCOUNTER — Other Ambulatory Visit: Payer: Self-pay | Admitting: Family Medicine

## 2021-01-06 ENCOUNTER — Ambulatory Visit
Admission: EM | Admit: 2021-01-06 | Discharge: 2021-01-06 | Disposition: A | Discharge status: Home / Self Care | Payer: PPO | Attending: Emergency Medicine | Admitting: Emergency Medicine

## 2021-01-06 ENCOUNTER — Encounter: Payer: Self-pay | Admitting: Emergency Medicine

## 2021-01-06 ENCOUNTER — Other Ambulatory Visit: Payer: Self-pay

## 2021-01-06 DIAGNOSIS — R1013 Epigastric pain: Secondary | ICD-10-CM | POA: Diagnosis not present

## 2021-01-06 DIAGNOSIS — R109 Unspecified abdominal pain: Secondary | ICD-10-CM | POA: Diagnosis not present

## 2021-01-06 MED ORDER — ONDANSETRON 8 MG PO TBDP: 8.0000 mg | ORAL_TABLET | Freq: Once | ORAL | Status: DC

## 2021-01-06 NOTE — ED Triage Notes (Signed)
Patient c/o mid upper abdominal pain that started early this morning.  Patient states that pain has gotten worse.  Patient denies N/V/D.

## 2021-01-06 NOTE — ED Provider Notes (Addendum)
HPI  SUBJECTIVE:  Martin Bauer is a 75 y.o. male who presents with severe, worsening left upper quadrant/left-sided abdominal pain starting today.  It is constant, stabbing.  It does not migrate or radiate.  He reports nausea, but no vomiting, fever, chest pain, shortness of breath, abdominal distention.  No new or different back pain.  He is not sure if the car ride over here was painful.  He states that he was at the [place] yesterday but cannot remember what for, but states it was not due to his abdomen.  He tried taking pantoprazole, eating papaya and sardines without improvement.  Symptoms are worse with movement and palpation.  He has never had symptoms like this before.  He has a past medical history of TBI, PTSD, hypertension and is status post prostate surgery.  Denies any other abdominal surgeries, aortic abdominal aneurysm, peptic ulcer disease, diabetes, chronic kidney disease.  [facility]    Past Medical History:  Diagnosis Date   Hypertension    Post traumatic stress disorder (PTSD)     History reviewed. No pertinent surgical history.  History reviewed. No pertinent family history.  Social History   Tobacco Use   Smoking status: Former    Pack years: 0.00   Smokeless tobacco: Never  Vaping Use   Vaping Use: Never used  Substance Use Topics   Alcohol use: Yes    Comment: soccially   Drug use: No     Current Facility-Administered Medications:    ondansetron (ZOFRAN-ODT) disintegrating tablet 8 mg, 8 mg, Oral, Once, Domenick Gong, MD  Current Outpatient Medications:    pantoprazole (PROTONIX) 40 MG tablet, Take 1 tablet (40 mg total) by mouth daily., Disp: 90 tablet, Rfl: 0   cloNIDine (CATAPRES) 0.1 MG tablet, Take 1 tablet (0.1 mg total) by mouth 2 (two) times daily., Disp: 180 tablet, Rfl: 0  Allergies  Allergen Reactions   Doxazosin Shortness Of Breath and Other (See Comments)   Morphine Shortness Of Breath   Ciprofloxacin Diarrhea   Tamsulosin Other (See  Comments)   Terazosin Other (See Comments)   Latex Rash     ROS  As noted in HPI.   Physical Exam  BP (!) 186/79 (BP Location: Left Arm)   Pulse (!) 52   Temp (!) 97.5 F (36.4 C) (Oral)   Resp 16   Ht 5\' 8"  (1.727 m)   Wt 57.6 kg   SpO2 98%   BMI 19.31 kg/m   Constitutional: Well developed, well nourished, appears uncomfortable. Eyes:  EOMI, conjunctiva normal bilaterally HENT: Normocephalic, atraumatic,mucus membranes moist Respiratory: Normal inspiratory effort, lungs clear bilaterally Cardiovascular: Regular bradycardia, no murmurs rubs or gallops. GI: Supra umbilical surgical scar.  Diffuse tenderness.  Positive rigidity.  Positive voluntary guarding.  No rebound.  No palpable pulsatile mass.  Positive tap table test. Back: No CVAT skin: No rash, skin intact Musculoskeletal: no deformities Neurologic: Alert & oriented x 3, no focal neuro deficits Psychiatric: Speech and behavior appropriate   ED Course   Medications  ondansetron (ZOFRAN-ODT) disintegrating tablet 8 mg (has no administration in time range)    Orders Placed This Encounter  Procedures   ED EKG    Epigastric pain    Standing Status:   Standing    Number of Occurrences:   1    Order Specific Question:   Reason for Exam    Answer:   Other (See Comments)   EKG 12-Lead    Standing Status:   Standing  Number of Occurrences:   1    No results found for this or any previous visit (from the past 24 hour(s)). No results found.  ED Clinical Impression  1. Epigastric pain      ED Assessment/Plan  EKG: [place] bradycardia, rate 47.  Normal axis, normal intervals.  No hypertrophy.  No ST elevation.  T wave inversion in V1, aVL, present on previous EKG from 10/21.  T wave inversion in V2, which is new.  Patient appears to be in a significant amount of pain.  He has a rigid abdomen.  Differential for his pain is vast and includes viscus perforation, leaking abdominal aneurysm, acute obstruction.  discussed with patient that he needs to go to the emergency department via EMS for comprehensive evaluation and pain control.   Discussed with him that I was concerned that this could be something serious and that something could happen along the way.  Patient refuses to go via EMS multiple times. Went to go get patient Zofran.  Patient walked out of the clinic while I was getting him medication.  Staff member and I went after him into the parking lot and tried calling him back, but the patient ignored [place], got into his car and left.  Patient is leaving AMA.   Meds ordered this encounter  Medications   ondansetron (ZOFRAN-ODT) disintegrating tablet 8 mg      *This clinic note was created using Scientist, clinical (histocompatibility and immunogenetics). Therefore, there may be occasional mistakes despite careful proofreading.  ?    Domenick Gong, MD 01/06/21 Martin Bauer    Domenick Gong, MD 01/06/21 2001

## 2021-03-29 DIAGNOSIS — I10 Essential (primary) hypertension: Secondary | ICD-10-CM | POA: Diagnosis not present

## 2021-03-29 DIAGNOSIS — F419 Anxiety disorder, unspecified: Secondary | ICD-10-CM | POA: Diagnosis not present

## 2021-04-12 DIAGNOSIS — F419 Anxiety disorder, unspecified: Secondary | ICD-10-CM | POA: Diagnosis not present

## 2021-04-12 DIAGNOSIS — I10 Essential (primary) hypertension: Secondary | ICD-10-CM | POA: Diagnosis not present

## 2021-04-14 DIAGNOSIS — G8929 Other chronic pain: Secondary | ICD-10-CM | POA: Diagnosis not present

## 2021-04-14 DIAGNOSIS — R070 Pain in throat: Secondary | ICD-10-CM | POA: Diagnosis not present

## 2021-05-15 DIAGNOSIS — J301 Allergic rhinitis due to pollen: Secondary | ICD-10-CM | POA: Diagnosis not present

## 2021-05-15 DIAGNOSIS — K219 Gastro-esophageal reflux disease without esophagitis: Secondary | ICD-10-CM | POA: Diagnosis not present

## 2021-05-15 DIAGNOSIS — R1312 Dysphagia, oropharyngeal phase: Secondary | ICD-10-CM | POA: Diagnosis not present

## 2021-05-18 DIAGNOSIS — F419 Anxiety disorder, unspecified: Secondary | ICD-10-CM | POA: Diagnosis not present

## 2021-05-18 DIAGNOSIS — F32A Depression, unspecified: Secondary | ICD-10-CM | POA: Diagnosis not present

## 2021-05-18 DIAGNOSIS — R001 Bradycardia, unspecified: Secondary | ICD-10-CM | POA: Diagnosis not present

## 2021-05-18 DIAGNOSIS — I10 Essential (primary) hypertension: Secondary | ICD-10-CM | POA: Diagnosis not present

## 2021-05-18 DIAGNOSIS — F5101 Primary insomnia: Secondary | ICD-10-CM | POA: Diagnosis not present

## 2021-05-18 DIAGNOSIS — K219 Gastro-esophageal reflux disease without esophagitis: Secondary | ICD-10-CM | POA: Diagnosis not present

## 2024-01-28 ENCOUNTER — Encounter: Payer: Self-pay | Admitting: Emergency Medicine

## 2024-01-28 ENCOUNTER — Ambulatory Visit
Admission: EM | Admit: 2024-01-28 | Discharge: 2024-01-28 | Disposition: A | Discharge status: Home / Self Care | Attending: Emergency Medicine | Admitting: Emergency Medicine

## 2024-01-28 DIAGNOSIS — J029 Acute pharyngitis, unspecified: Secondary | ICD-10-CM

## 2024-01-28 MED ORDER — PENICILLIN G BENZATHINE 1200000 UNIT/2ML IM SUSY
1.2000 10*6.[IU] | PREFILLED_SYRINGE | Freq: Once | INTRAMUSCULAR | Status: AC
  Administered 2024-01-28: 1.2 10*6.[IU] via INTRAMUSCULAR

## 2024-01-28 NOTE — ED Triage Notes (Signed)
 Pt states he has an irritated throat x 2 days.

## 2024-01-28 NOTE — ED Provider Notes (Signed)
 HPI  SUBJECTIVE:  Martin Bauer is a 78 y.o. male who presents with an irritated, but not sore, throat for over the past week with accompanying postnasal drip, cough and chest congestion.  He is status post cholecystectomy where he underwent general anesthesia 5 to 6 days ago, but states that he had the irritated throat prior to the surgery.  No fevers, body aches.  Headache started today.  No nasal congestion, rhinorrhea, sinus pain or pressure.  His cough is productive of mucus, but this is not changed since March.  No wheezing, chest pain, change in his baseline shortness of breath.  No nausea, vomiting, abdominal pain, rash.  No GERD or allergy symptoms.  No known COVID, flu, strep exposure.  No drooling, trismus, neck stiffness, voice changes, sensation of throat swelling shut.  He has not tried anything for symptoms.  No alleviating factors.  His throat is worse when the air hits it, causing him to cough.  States he has had multiple recent chest x-rays at the [facility] that were negative over the past few months.  He has a past medical history hypertension, PTSD, COPD.  He is status postcholecystectomy 5 to 6 days ago.  He has been prescribed Protonix  and the past although he denies a history of GERD.  No history of allergies.  [facility]  Past Medical History:  Diagnosis Date   Hypertension    Post traumatic stress disorder (PTSD)     History reviewed. No pertinent surgical history.  No family history on file.  Social History   Tobacco Use   Smoking status: Former   Smokeless tobacco: Never  Advertising account planner   Vaping status: Never Used  Substance Use Topics   Alcohol use: Yes    Comment: soccially   Drug use: No     Current Facility-Administered Medications:    penicillin g benzathine (BICILLIN LA) 1200000 UNIT/2ML injection 1.2 Million Units, 1.2 Million Units, Intramuscular, Once, Van Knee, MD  Current Outpatient Medications:    cloNIDine  (CATAPRES ) 0.1 MG tablet,  Take 1 tablet (0.1 mg total) by mouth 2 (two) times daily., Disp: 180 tablet, Rfl: 0   pantoprazole  (PROTONIX ) 40 MG tablet, Take 1 tablet (40 mg total) by mouth daily., Disp: 90 tablet, Rfl: 0  Allergies  Allergen Reactions   Doxazosin Shortness Of Breath and Other (See Comments)   Morphine Shortness Of Breath   Ciprofloxacin Diarrhea   Lisinopril -Hydrochlorothiazide    Tamsulosin Other (See Comments)   Terazosin Other (See Comments)   Latex Rash     ROS  As noted in HPI.   Physical Exam  BP (!) 155/76 (BP Location: Left Arm)   Pulse 64   Temp 98 F (36.7 C) (Oral)   Resp 16   SpO2 100%   Constitutional: Well developed, well nourished, no acute distress Eyes: PERRL, EOMI, conjunctiva normal bilaterally HENT: Normocephalic, atraumatic,mucus membranes moist.  No nasal congestion.  No maxillary, frontal sinus tenderness.  Slightly erythematous oropharynx.  Tonsils normal size without exudates.  Uvula midline.  Normal voice.  No neck stiffness.  No drooling, trismus. Neck: No cervical lymphadenopathy Respiratory: Clear to auscultation bilaterally, no rales, no wheezing, no rhonchi Cardiovascular: Normal rate and rhythm, no murmurs, no gallops, no rubs GI: nondistended skin: No rash, skin intact Musculoskeletal: no deformities Neurologic: Alert & oriented x 3, CN III-XII grossly intact, no motor deficits, sensation grossly intact Psychiatric: Speech and behavior appropriate   ED Course   Medications  penicillin g benzathine (BICILLIN  LA) 1200000 UNIT/2ML injection 1.2 Million Units (has no administration in time range)    No orders of the defined types were placed in this encounter.  No results found for this or any previous visit (from the past 24 hours). No results found.  ED Clinical Impression  1. Pharyngitis, unspecified etiology      ED Assessment/Plan     Offered to do chest x-ray given his cough, but he states that it has not changed since March and  he has had multiple x-rays since then which have all been negative.  He declined an x-ray today to evaluate for pneumonia, and I think that pneumonia is low enough in the differential that this is reasonable.  Offered to test for strep pharyngitis, however, patient is adamant on receiving a Bicillin shot for his pharyngitis.  I had a long discussion with him that there are multiple different causes for an irritated throat including acid reflux, postnasal drip, his recent general anesthesia, viral illnesses, and that I do not think that a Bicillin shot would be helpful as I do not think that an group A strep infection is causing his symptoms.  I discussed the risks of allergic reaction, diarrhea, yeast infections, however, he is quite insistent on getting Bicillin.  Will give 1,200,000 units of Bicillin for pharyngitis.  Discussed with him that if this does not work, then he needs to follow-up with his PCP at the [facility].  Will send home with Benadryl/Maalox mixture for supportive care.  Discussed MDM, treatment plan, and plan for follow-up with patient . patient agrees with plan.   Spent 35 minutes in the care of this patient.  Meds ordered this encounter  Medications   penicillin g benzathine (BICILLIN LA) 1200000 UNIT/2ML injection 1.2 Million Units    Antibiotic Indication::   Pharyngitis      *This clinic note was created using Scientist, clinical (histocompatibility and immunogenetics). Therefore, there may be occasional mistakes despite careful proofreading. ? MERLINDA Van Knee, MD 01/29/24 1840

## 2024-01-28 NOTE — Discharge Instructions (Signed)
 We have given you a shot of Bicillin today.  Make sure you drink plenty of extra fluids.  Some people find salt water gargles and  Traditional Medicinal's Throat Coat tea helpful. Take 5 mL of liquid Benadryl and 5 mL of Maalox/Mylanta. Mix it together, and then hold it in your mouth for as long as you can and then swallow. You may do this 4 times a day.  Honey and lemon dissolved in hot water can also be soothing.  Follow-up with your primary care provider at the [facility] if you are not getting any better.  Go to www.goodrx.com  or www.costplusdrugs.com to look up your medications. This will give you a list of where you can find your prescriptions at the most affordable prices. Or ask the pharmacist what the cash price is, or if they have any other discount programs available to help make your medication more affordable. This can be less expensive than what you would pay with insurance.

## 2024-03-16 ENCOUNTER — Emergency Department

## 2024-03-16 ENCOUNTER — Emergency Department
Admission: EM | Admit: 2024-03-16 | Discharge: 2024-03-16 | Disposition: A | Discharge status: Home / Self Care | Attending: Emergency Medicine | Admitting: Emergency Medicine

## 2024-03-16 DIAGNOSIS — I10 Essential (primary) hypertension: Secondary | ICD-10-CM | POA: Diagnosis not present

## 2024-03-16 DIAGNOSIS — R0603 Acute respiratory distress: Secondary | ICD-10-CM | POA: Insufficient documentation

## 2024-03-16 DIAGNOSIS — J449 Chronic obstructive pulmonary disease, unspecified: Secondary | ICD-10-CM | POA: Insufficient documentation

## 2024-03-16 DIAGNOSIS — Z5329 Procedure and treatment not carried out because of patient's decision for other reasons: Secondary | ICD-10-CM | POA: Diagnosis not present

## 2024-03-16 LAB — BLOOD GAS, VENOUS
Acid-base deficit: 4.4 mmol/L — ABNORMAL HIGH (ref 0.0–2.0)
Bicarbonate: 22.6 mmol/L (ref 20.0–28.0)
O2 Saturation: 66.9 %
Patient temperature: 37
pCO2, Ven: 48 mmHg (ref 44–60)
pH, Ven: 7.28 (ref 7.25–7.43)
pO2, Ven: 41 mmHg (ref 32–45)

## 2024-03-16 LAB — COMPREHENSIVE METABOLIC PANEL WITH GFR
ALT: 32 U/L (ref 0–44)
AST: 50 U/L — ABNORMAL HIGH (ref 15–41)
Albumin: 4.2 g/dL (ref 3.5–5.0)
Alkaline Phosphatase: 83 U/L (ref 38–126)
Anion gap: 14 (ref 5–15)
BUN: 25 mg/dL — ABNORMAL HIGH (ref 8–23)
CO2: 17 mmol/L — ABNORMAL LOW (ref 22–32)
Calcium: 9.7 mg/dL (ref 8.9–10.3)
Chloride: 102 mmol/L (ref 98–111)
Creatinine, Ser: 1.27 mg/dL — ABNORMAL HIGH (ref 0.61–1.24)
GFR, Estimated: 59 mL/min — ABNORMAL LOW (ref >60)
Glucose, Bld: 280 mg/dL — ABNORMAL HIGH (ref 70–99)
Potassium: 4.3 mmol/L (ref 3.5–5.1)
Sodium: 133 mmol/L — ABNORMAL LOW (ref 135–145)
Total Bilirubin: 1 mg/dL (ref 0.0–1.2)
Total Protein: 7.7 g/dL (ref 6.5–8.1)

## 2024-03-16 LAB — CBC WITH DIFFERENTIAL/PLATELET
Abs Immature Granulocytes: 0.07 K/uL (ref 0.00–0.07)
Basophils Absolute: 0.1 K/uL (ref 0.0–0.1)
Basophils Relative: 1 %
Eosinophils Absolute: 0.5 K/uL (ref 0.0–0.5)
Eosinophils Relative: 5 %
HCT: 43.5 % (ref 39.0–52.0)
Hemoglobin: 13.4 g/dL (ref 13.0–17.0)
Immature Granulocytes: 1 %
Lymphocytes Relative: 23 %
Lymphs Abs: 2.3 K/uL (ref 0.7–4.0)
MCH: 30.7 pg (ref 26.0–34.0)
MCHC: 30.8 g/dL (ref 30.0–36.0)
MCV: 99.5 fL (ref 80.0–100.0)
Monocytes Absolute: 0.7 K/uL (ref 0.1–1.0)
Monocytes Relative: 7 %
Neutro Abs: 6.6 K/uL (ref 1.7–7.7)
Neutrophils Relative %: 63 %
Platelets: 456 K/uL — ABNORMAL HIGH (ref 150–400)
RBC: 4.37 MIL/uL (ref 4.22–5.81)
RDW: 14.5 % (ref 11.5–15.5)
WBC: 10.2 K/uL (ref 4.0–10.5)
nRBC: 0 % (ref 0.0–0.2)

## 2024-03-16 LAB — BRAIN NATRIURETIC PEPTIDE: B Natriuretic Peptide: 2701.6 pg/mL — ABNORMAL HIGH (ref 0.0–100.0)

## 2024-03-16 LAB — TROPONIN I (HIGH SENSITIVITY): Troponin I (High Sensitivity): 64 ng/L — ABNORMAL HIGH (ref <18)

## 2024-03-16 NOTE — ED Notes (Signed)
 Called RT to notify of [facility] at this time

## 2024-03-16 NOTE — ED Provider Notes (Signed)
 [facility] Provider Note    Event Date/Time   First MD Initiated Contact with Patient 03/16/24 1843     (approximate)   History   Chief Complaint Respiratory Distress   HPI  Martin Bauer is a 78 y.o. male with past medical history of hypertension, COPD, and PTSD who presents to the ED complaining of shortness of breath.  Patient reports acute onset of difficulty breathing about an hour prior to arrival and was found in respiratory distress by EMS.  EMS reported that patient appeared cyanotic when they arrived, was placed on CPAP and given IV magnesium and Solu-Medrol along with 4 DuoNebs.  Patient reportedly had significant wheezing per EMS, but work of breathing gradually improved during transport.  Patient denies any chest pain with this episode, states he has been having similar intermittent episodes of difficulty breathing over the past 2 days.  He has noticed a small amount of swelling in his legs, denies pain in either leg.  He does not wear oxygen at baseline.     Physical Exam   Triage Vital Signs: ED Triage Vitals  Encounter Vitals Group     BP --      Girls Systolic BP Percentile --      Girls Diastolic BP Percentile --      Boys Systolic BP Percentile --      Boys Diastolic BP Percentile --      Pulse Rate 03/16/24 1839 (!) 57     Resp --      Temp --      Temp src --      SpO2 03/16/24 1839 100 %     Weight --      Height --      Head Circumference --      Peak Flow --      Pain Score 03/16/24 1842 0     Pain Loc --      Pain Education --      Exclude from Growth Chart --     Most recent vital signs: Vitals:   03/16/24 1900 03/16/24 1905  BP: 117/74   Pulse: (!) 58   Resp: 19   Temp:  98.1 F (36.7 C)  SpO2: 100%     Constitutional: Alert and oriented. Eyes: Conjunctivae are normal. Head: Atraumatic. Nose: No congestion/rhinnorhea. Mouth/Throat: Mucous membranes are moist.  Cardiovascular: Normal rate, regular  rhythm. Grossly normal heart sounds.  2+ radial pulses bilaterally. Respiratory: Increased respiratory effort.  No retractions. Lungs with end expiratory wheezing throughout. Gastrointestinal: Soft and nontender. No distention. Musculoskeletal: No lower extremity tenderness nor edema.  Neurologic:  Normal speech and language. No gross focal neurologic deficits are appreciated.    ED Results / Procedures / Treatments   Labs (all labs ordered are listed, but only abnormal results are displayed) Labs Reviewed  CBC WITH DIFFERENTIAL/PLATELET - Abnormal; Notable for the following components:      Result Value   Platelets 456 (*)    All other components within normal limits  COMPREHENSIVE METABOLIC PANEL WITH GFR - Abnormal; Notable for the following components:   Sodium 133 (*)    CO2 17 (*)    Glucose, Bld 280 (*)    BUN 25 (*)    Creatinine, Ser 1.27 (*)    AST 50 (*)    GFR, Estimated 59 (*)    All other components within normal limits  BRAIN NATRIURETIC PEPTIDE - Abnormal; Notable for the following components:  B Natriuretic Peptide 2,701.6 (*)    All other components within normal limits  BLOOD GAS, VENOUS - Abnormal; Notable for the following components:   Acid-base deficit 4.4 (*)    All other components within normal limits  TROPONIN I (HIGH SENSITIVITY) - Abnormal; Notable for the following components:   Troponin I (High Sensitivity) 64 (*)    All other components within normal limits  MAGNESIUM     EKG  ED ECG REPORT I, Carlin Palin, the attending physician, personally viewed and interpreted this ECG.   Date: 03/16/2024  EKG Time: 19:01  Rate: 57  Rhythm: sinus bradycardia  Axis: Normal  Intervals:none  ST&T Change: Diffuse T wave inversions without ST changes  RADIOLOGY Chest x-ray reviewed and interpreted by me with left lower lobe infiltrate.  PROCEDURES:  Critical Care performed: Yes, see critical care procedure note(s)  .Critical  Care  Performed by: Palin Carlin, MD Authorized by: Palin Carlin, MD   Critical care provider statement:    Critical care time (minutes):  30   Critical care time was exclusive of:  Separately billable procedures and treating other patients and teaching time   Critical care was necessary to treat or prevent imminent or life-threatening deterioration of the following conditions:  Respiratory failure   Critical care was time spent personally by me on the following activities:  Development of treatment plan with patient or surrogate, discussions with consultants, evaluation of patient's response to treatment, examination of patient, ordering and review of laboratory studies, ordering and review of radiographic studies, ordering and performing treatments and interventions, pulse oximetry, re-evaluation of patient's condition and review of old charts   I assumed direction of critical care for this patient from another provider in my specialty: no     Care discussed with: admitting provider      MEDICATIONS ORDERED IN ED: Medications - No data to display   IMPRESSION / MDM / ASSESSMENT AND PLAN / ED COURSE  I reviewed the triage vital signs and the nursing notes.                              78 y.o. male with past medical history of hypertension, COPD, and PTSD who presents to the ED complaining of intermittent episodes of severe difficulty breathing over the past 2 days, found to be in respiratory distress by EMS.  Patient's presentation is most consistent with acute presentation with potential threat to life or bodily function.  Differential diagnosis includes, but is not limited to, COPD exacerbation, ACS, PE, pneumonia, pneumothorax, CHF, anemia, electrolyte abnormality.  Patient in mild respiratory distress upon arrival, was transition from CPAP to BiPAP and work of breathing seems to be improving with mild to moderate ongoing wheezing.  Prehospital EKG shows some ST elevation  anteriorly but does not meet STEMI criteria.  He did have recent admission to [facility] and catheterization 10 days ago that showed no obstructive CAD, patient eventually diagnosed with Takotsubo cardiomyopathy.  EKG today shows diffuse T wave inversions concerning for global ischemia with no ST elevation.  Labs and chest x-ray are pending at this time.  Labs without significant anemia, leukocytosis, or electrolyte abnormality but patient does have mild AKI.  VBG without significant hypercapnia, he does have mildly elevated troponin and significantly elevated BNP.  Patient's respiratory status improved and he was transition from BiPAP to nasal cannula at 4 L, currently maintaining oxygen saturations.  I went to reassess him  and recommend that we proceed with CTA of his chest to rule out PE as well as admission for additional cardiac workup.  Patient is adamant that he be discharged now, repeatedly states that today's episode was due to stress.  He is adamant that he does not want any more testing in the hospital and wants to follow-up at the [facility].  I offered transfer to the [facility] for further care, but patient declines and states he wants to go home.  I explained risk of recurrent respiratory distress, arrhythmia, heart attack, and death which patient expresses understanding of and would still like to leave the hospital AGAINST MEDICAL ADVICE.  He was offered antibiotics for possible pneumonia seen on chest x-ray, but states he is not interested in antibiotics.  He was counseled to follow-up at the [facility] and to return to the ED at any time for reevaluation.  Patient expresses understanding.      FINAL CLINICAL IMPRESSION(S) / ED DIAGNOSES   Final diagnoses:  Respiratory distress     Rx / DC Orders   ED Discharge Orders     None        Note:  This document was prepared using Dragon voice recognition software and may include unintentional dictation errors.   Willo Dunnings, MD 03/16/24  [phone]

## 2024-03-16 NOTE — ED Notes (Addendum)
 Pt taking off BiPAP and agitated. Pt placed on 4L [place]. MD in room. Pt stating wanting to leave AMA. MD educating pt at bedside.

## 2024-03-16 NOTE — ED Triage Notes (Signed)
 Pt presents to the ED via [facility] from home for respiratory distress. Pt brought in on CPAP. EMS was called out for breathing difficulty. Upon arrival patient was diaphoretic. Lung sounds were diminished with high pitch wheezing. Pt was cyanotic in color upon EMS arrival.   MD, RT and nursing staff at bedside at time of pt's arrival. Pt immediately placed on bipap.   Pt presents with BLE pitting edema  2 grams of Mag  0.3mg  IM epi 4 duo nebs 125mg  solu-medrol

## 2024-03-16 NOTE — ED Notes (Signed)
 Called [facility] for central monitoring at this time
